# Patient Record
Sex: Male | Born: 1975 | Race: Black or African American | Hispanic: No | Marital: Single | Smoking: Current every day smoker
Health system: Southern US, Community
[De-identification: ages and names within clinical notes are randomized; demographics above are authoritative.]

## PROBLEM LIST (undated history)

## (undated) DIAGNOSIS — K219 Gastro-esophageal reflux disease without esophagitis: Secondary | ICD-10-CM

## (undated) HISTORY — PX: HAND SURGERY: SHX662

---

## 2007-11-22 ENCOUNTER — Emergency Department (HOSPITAL_COMMUNITY): Admission: EM | Admit: 2007-11-22 | Discharge: 2007-11-22 | Payer: Self-pay | Admitting: Emergency Medicine

## 2017-05-28 ENCOUNTER — Encounter (HOSPITAL_COMMUNITY): Payer: Self-pay | Admitting: Emergency Medicine

## 2017-05-28 DIAGNOSIS — G43909 Migraine, unspecified, not intractable, without status migrainosus: Secondary | ICD-10-CM | POA: Insufficient documentation

## 2017-05-28 DIAGNOSIS — F17210 Nicotine dependence, cigarettes, uncomplicated: Secondary | ICD-10-CM | POA: Insufficient documentation

## 2017-05-28 DIAGNOSIS — Z79899 Other long term (current) drug therapy: Secondary | ICD-10-CM | POA: Insufficient documentation

## 2017-05-28 LAB — URINALYSIS, ROUTINE W REFLEX MICROSCOPIC
Bilirubin Urine: NEGATIVE
GLUCOSE, UA: NEGATIVE mg/dL
Hgb urine dipstick: NEGATIVE
KETONES UR: NEGATIVE mg/dL
LEUKOCYTES UA: NEGATIVE
Nitrite: NEGATIVE
PH: 6 (ref 5.0–8.0)
Protein, ur: NEGATIVE mg/dL
Specific Gravity, Urine: 1.018 (ref 1.005–1.030)

## 2017-05-28 LAB — CBC
HEMATOCRIT: 40.3 % (ref 39.0–52.0)
Hemoglobin: 13.7 g/dL (ref 13.0–17.0)
MCH: 31.3 pg (ref 26.0–34.0)
MCHC: 34 g/dL (ref 30.0–36.0)
MCV: 92 fL (ref 78.0–100.0)
Platelets: 185 10*3/uL (ref 150–400)
RBC: 4.38 MIL/uL (ref 4.22–5.81)
RDW: 12.2 % (ref 11.5–15.5)
WBC: 5.2 10*3/uL (ref 4.0–10.5)

## 2017-05-28 LAB — COMPREHENSIVE METABOLIC PANEL
ALBUMIN: 3.9 g/dL (ref 3.5–5.0)
ALT: 24 U/L (ref 17–63)
AST: 31 U/L (ref 15–41)
Alkaline Phosphatase: 51 U/L (ref 38–126)
Anion gap: 11 (ref 5–15)
BUN: 8 mg/dL (ref 6–20)
CHLORIDE: 103 mmol/L (ref 101–111)
CO2: 25 mmol/L (ref 22–32)
Calcium: 8.6 mg/dL — ABNORMAL LOW (ref 8.9–10.3)
Creatinine, Ser: 1.19 mg/dL (ref 0.61–1.24)
GFR calc Af Amer: >60 mL/min (ref >60)
Glucose, Bld: 137 mg/dL — ABNORMAL HIGH (ref 65–99)
POTASSIUM: 3.3 mmol/L — AB (ref 3.5–5.1)
SODIUM: 139 mmol/L (ref 135–145)
Total Bilirubin: 0.7 mg/dL (ref 0.3–1.2)
Total Protein: 6.9 g/dL (ref 6.5–8.1)

## 2017-05-28 LAB — LIPASE, BLOOD: LIPASE: 29 U/L (ref 11–51)

## 2017-05-28 NOTE — ED Triage Notes (Signed)
Pt also reports abdominal pain with n/v beginning today as well.

## 2017-05-28 NOTE — ED Triage Notes (Signed)
Pt c/o migraine x 3 days. States pain got worse with pressure this evening. Sensitivity to light.

## 2017-05-29 ENCOUNTER — Emergency Department (HOSPITAL_COMMUNITY): Payer: BLUE CROSS/BLUE SHIELD

## 2017-05-29 ENCOUNTER — Emergency Department (HOSPITAL_COMMUNITY)
Admission: EM | Admit: 2017-05-29 | Discharge: 2017-05-29 | Disposition: A | Discharge status: Home / Self Care | Payer: BLUE CROSS/BLUE SHIELD | Attending: Emergency Medicine | Admitting: Emergency Medicine

## 2017-05-29 DIAGNOSIS — G43909 Migraine, unspecified, not intractable, without status migrainosus: Secondary | ICD-10-CM

## 2017-05-29 HISTORY — DX: Gastro-esophageal reflux disease without esophagitis: K21.9

## 2017-05-29 MED ORDER — METOCLOPRAMIDE HCL 5 MG/ML IJ SOLN
10.0000 mg | Freq: Once | INTRAMUSCULAR | Status: DC
  Filled 2017-05-29: qty 2

## 2017-05-29 MED ORDER — KETOROLAC TROMETHAMINE 15 MG/ML IJ SOLN
15.0000 mg | Freq: Once | INTRAMUSCULAR | Status: DC
  Filled 2017-05-29: qty 1

## 2017-05-29 MED ORDER — IBUPROFEN 600 MG PO TABS
600.0000 mg | ORAL_TABLET | Freq: Four times a day (QID) | ORAL | 0 refills | Status: AC | PRN
Start: 2017-05-29 — End: ?

## 2017-05-29 NOTE — ED Notes (Signed)
Patient transported to CT 

## 2017-05-29 NOTE — Discharge Instructions (Signed)
We saw you in the ER for headaches. All the labs and imaging are normal. We are not sure what is causing your headaches, however, there appears to be no evidence of infection, bleeds or tumors based on our exam and results. We suspect migraine type headache or cluster headache  Please take motrin round the clock for the next 6 hours, and take other meds prescribed only for break through pain. See your doctor if the pain persists, as you might need better medications or a specialist.  Please return to the ER if the headache gets severe and in not improving, you have associated new one sided numbness, tingling, weakness or confusion, seizures, poor balance or poor vision.

## 2017-05-29 NOTE — ED Provider Notes (Signed)
[facility]-EMERGENCY DEPT Provider Note   CSN: [id number] Arrival date & time: 05/28/17  2117  By signing my name below, I, Diona Browner, attest that this documentation has been prepared under the direction and in the presence of Derwood Kaplan, MD. Electronically Signed: Diona Browner, ED Scribe. 05/29/17. 2:01 AM.  History   Chief Complaint Chief Complaint  Patient presents with  . Migraine  . Abdominal Pain    HPI Jimmy Arellano is a 41 y.o. male who presents to the Emergency Department complaining of gradually worsening, constant, pressure to the left sided temple that started ~ 2 days ago. Pt currently rates his pain a 5/10 severity but at its worst, it was 10/10 severity. Associated sx include nausea, vomiting and left sided weakness, dizziness, sound sensitivity, photophobic Lying down exacerbates his discomfort. Pt notes his discomfort started improving ~ 11:30 pm. He has tried ibuprofen with mild to no relief, last does around 6 pm. Pt reports bumping his head a couple of days ago at work. He denies LOC and "seeing stars." No hx of HA. Occasionally drinks. No drug use. Doesn't smoke. Pt denies numbness.  Additionally he c/o of diarrhea everyday for the last year. Not sure if blood in stool. No weight change. Not sure of FHx.   The history is provided by the patient. No language interpreter was used.    Past Medical History:  Diagnosis Date  . GERD (gastroesophageal reflux disease)     There are no active problems to display for this patient.   Past Surgical History:  Procedure Laterality Date  . HAND SURGERY         Home Medications    Prior to Admission medications   Medication Sig Start Date End Date Taking? Authorizing Provider  omeprazole (PRILOSEC) 20 MG capsule Take 20 mg by mouth daily.   Yes [provider]  ibuprofen (ADVIL,MOTRIN) 600 MG tablet Take 1 tablet (600 mg total) by mouth every 6 (six) hours as needed. 05/29/17   Derwood Kaplan,  MD    Family History No family history on file.  Social History Social History  Substance Use Topics  . Smoking status: Current Every Day Smoker    Types: Cigarettes  . Smokeless tobacco: Never Used  . Alcohol use Yes     Allergies   Patient has no known allergies.   Review of Systems Review of Systems  Eyes: Positive for photophobia.  Gastrointestinal: Positive for diarrhea, nausea and vomiting.  Neurological: Positive for dizziness, weakness and headaches. Negative for numbness.  All other systems reviewed and are negative.    Physical Exam Updated Vital Signs BP 136/86   Pulse (!) 49   Temp 98.6 F (37 C) (Oral)   Resp 16   Ht 6' (1.829 m)   Wt 86.2 kg (190 lb)   SpO2 99%   BMI 25.77 kg/m   Physical Exam  Constitutional: He is oriented to person, place, and time. He appears well-developed and well-nourished.  HENT:  Head: Normocephalic.  Eyes: EOM are normal. Pupils are equal, round, and reactive to light.  Pupils are 4 mm equal.  Neck: Normal range of motion.  No meningismus.  Cardiovascular: Normal rate, regular rhythm and normal heart sounds.   Pulmonary/Chest: Effort normal.  Abdominal: He exhibits no distension.  Musculoskeletal: Normal range of motion.  Neurological: He is alert and oriented to person, place, and time.  No horizontal or vertical nystagmus. Upper and lower extremity motor and sensory exam are grossly normal and equal.  Cerebella exam reveals no dysmetria. No carotid bruit.  Psychiatric: He has a normal mood and affect.  Nursing note and vitals reviewed.    ED Treatments / Results  DIAGNOSTIC STUDIES: Oxygen Saturation is 99% on RA, normal by my interpretation.   COORDINATION OF CARE: 2:01 AM-Discussed next steps with pt. Pt verbalized understanding and is agreeable with the plan.   Labs (all labs ordered are listed, but only abnormal results are displayed) Labs Reviewed  COMPREHENSIVE METABOLIC PANEL - Abnormal; Notable  for the following:       Result Value   Potassium 3.3 (*)    Glucose, Bld 137 (*)    Calcium 8.6 (*)    All other components within normal limits  LIPASE, BLOOD  CBC  URINALYSIS, ROUTINE W REFLEX MICROSCOPIC    EKG  EKG Interpretation  Date/Time:  Wednesday May 28 2017 21:17:19 EDT Ventricular Rate:  87 PR Interval:  156 QRS Duration: 98 QT Interval:  384 QTC Calculation: 462 R Axis:   89 Text Interpretation:  Normal sinus rhythm Incomplete right bundle branch block Nonspecific T wave abnormality Prolonged QT Abnormal ECG Otherwise no significant change Confirmed by Drema Pryardama, Pedro [phone]([id number]) on 05/28/2017 9:29:12 PM       Radiology Ct Head Wo Contrast  Result Date: 05/29/2017 CLINICAL DATA:  Severe headache. EXAM: CT HEAD WITHOUT CONTRAST TECHNIQUE: Contiguous axial images were obtained from the base of the skull through the vertex without intravenous contrast. COMPARISON:  Head CT 11/22/2007 FINDINGS: Brain: No evidence of acute infarction, hemorrhage, hydrocephalus, extra-axial collection or mass lesion/mass effect. Vascular: No hyperdense vessel or unexpected calcification. Skull: Normal. Negative for fracture or focal lesion. Sinuses/Orbits: Paranasal sinuses and mastoid air cells are clear. The visualized orbits are unremarkable. Other: None. IMPRESSION: Unremarkable noncontrast head CT.  No explanation for headache. Electronically Signed   By: Rubye OaksMelanie  Ehinger M.D.   On: 05/29/2017 03:38    Procedures Procedures (including critical care time)  Medications Ordered in ED Medications - No data to display   Initial Impression / Assessment and Plan / ED Course  I have reviewed the triage vital signs and the nursing notes.  Pertinent labs & imaging results that were available during my care of the patient were reviewed by me and considered in my medical decision making (see chart for details).     Pt comes in with cc of headache. Headache is unilateral, with nausea and  emesis. Pt has no hx of similar headache. At some point the headache was described as the worst headache of the life. Neuro exam is non focal and there is no meningismus. Family hx is unclear. Social hx otherwise unremarkable.  DDX includes: Primary headaches - including migrainous headaches, cluster headaches, tension headaches. ICH Carotid dissection Cavernous sinus thrombosis Meningitis Encephalitis Sinusitis Tumor Vascular headaches AV malformation Brain aneurysm Muscular headaches  A/P: Ct head ordered. We [facility] given the patient stated that the headache is the worst he had had, but complex migraine is more likely.   REASSESSMENT: Results from the ER workup discussed with the patient face to face and all questions answered to the best of my ability. CT head is neg. Labs reassuring. Patient reported that the pain is a lot better - w/o any meds.  I discussed with the patient that occult brain bleeds can be missed on CT scan, and we have considered bran bleed in our ddx. We also discussed that the headache has migraine characteristics - but that he has never  had migraines or similar headache and  Males are less likely to get migraines. We then discussed LP to r/o SAH now. Pt appeared apprehensive, so we gave him the option of aggressive management, which includes LP and r/o SAH vs. Conservative approach which would be no LP, d/c with motrin and Neuro f/u with ER return if the symptoms get worse. Risk and benefits for both discussed, including the possible complications of missed SAH and related morbidity and possible mortality. I allowed pt and family to discuss their options and checked with them 20 min later, and they had decided to go with the conservative approach.  Strict ER return precautions have been discussed, and patient is agreeing with the plan and is comfortable with the workup done and the recommendations from the ER.    Final Clinical  Impressions(s) / ED Diagnoses   Final diagnoses:  Migraine without status migrainosus, not intractable, unspecified migraine type    New Prescriptions Discharge Medication List as of 05/29/2017  5:53 AM    START taking these medications   Details  ibuprofen (ADVIL,MOTRIN) 600 MG tablet Take 1 tablet (600 mg total) by mouth every 6 (six) hours as needed., Starting Thu 05/29/2017, Print       I personally performed the services described in this documentation, which was scribed in my presence. The recorded information has been reviewed and is accurate.     Derwood Kaplan, MD 05/31/17 [phone]

## 2017-05-29 NOTE — ED Notes (Signed)
Per Selena BattenKim in CT, pt is next on the list. Pt made aware.

## 2017-05-29 NOTE — ED Notes (Signed)
Pt able to ambulate in waiting room to vending machines and back to seat.

## 2017-05-29 NOTE — ED Notes (Signed)
In room to medicate patient.  States "I've been here seven hours and they are gonna give me something now."  Physician made aware of refusal.

## 2017-10-27 ENCOUNTER — Encounter (HOSPITAL_COMMUNITY): Payer: Self-pay | Admitting: Emergency Medicine

## 2017-10-27 ENCOUNTER — Emergency Department (HOSPITAL_COMMUNITY)
Admission: EM | Admit: 2017-10-27 | Discharge: 2017-10-27 | Disposition: A | Discharge status: Home / Self Care | Payer: BLUE CROSS/BLUE SHIELD | Attending: Emergency Medicine | Admitting: Emergency Medicine

## 2017-10-27 DIAGNOSIS — F329 Major depressive disorder, single episode, unspecified: Secondary | ICD-10-CM | POA: Insufficient documentation

## 2017-10-27 DIAGNOSIS — Z5329 Procedure and treatment not carried out because of patient's decision for other reasons: Secondary | ICD-10-CM | POA: Insufficient documentation

## 2017-10-27 DIAGNOSIS — F17210 Nicotine dependence, cigarettes, uncomplicated: Secondary | ICD-10-CM | POA: Insufficient documentation

## 2017-10-27 DIAGNOSIS — Z79899 Other long term (current) drug therapy: Secondary | ICD-10-CM | POA: Insufficient documentation

## 2017-10-27 DIAGNOSIS — Z046 Encounter for general psychiatric examination, requested by authority: Secondary | ICD-10-CM | POA: Diagnosis not present

## 2017-10-27 DIAGNOSIS — R45851 Suicidal ideations: Secondary | ICD-10-CM | POA: Diagnosis not present

## 2017-10-27 DIAGNOSIS — F32A Depression, unspecified: Secondary | ICD-10-CM

## 2017-10-27 LAB — COMPREHENSIVE METABOLIC PANEL
ALBUMIN: 4.1 g/dL (ref 3.5–5.0)
ALT: 20 U/L (ref 17–63)
AST: 27 U/L (ref 15–41)
Alkaline Phosphatase: 56 U/L (ref 38–126)
Anion gap: 9 (ref 5–15)
BUN: 9 mg/dL (ref 6–20)
CHLORIDE: 103 mmol/L (ref 101–111)
CO2: 27 mmol/L (ref 22–32)
Calcium: 9.1 mg/dL (ref 8.9–10.3)
Creatinine, Ser: 1.17 mg/dL (ref 0.61–1.24)
GFR calc Af Amer: >60 mL/min (ref >60)
GFR calc non Af Amer: >60 mL/min (ref >60)
GLUCOSE: 92 mg/dL (ref 65–99)
POTASSIUM: 3.6 mmol/L (ref 3.5–5.1)
Sodium: 139 mmol/L (ref 135–145)
Total Bilirubin: 0.9 mg/dL (ref 0.3–1.2)
Total Protein: 7.8 g/dL (ref 6.5–8.1)

## 2017-10-27 LAB — CBC
HEMATOCRIT: 44.8 % (ref 39.0–52.0)
HEMOGLOBIN: 15.5 g/dL (ref 13.0–17.0)
MCH: 32.3 pg (ref 26.0–34.0)
MCHC: 34.6 g/dL (ref 30.0–36.0)
MCV: 93.3 fL (ref 78.0–100.0)
Platelets: 208 10*3/uL (ref 150–400)
RBC: 4.8 MIL/uL (ref 4.22–5.81)
RDW: 12.8 % (ref 11.5–15.5)
WBC: 8.6 10*3/uL (ref 4.0–10.5)

## 2017-10-27 LAB — RAPID URINE DRUG SCREEN, HOSP PERFORMED
Amphetamines: NOT DETECTED
BARBITURATES: NOT DETECTED
BENZODIAZEPINES: NOT DETECTED
Cocaine: NOT DETECTED
Opiates: NOT DETECTED
Tetrahydrocannabinol: NOT DETECTED

## 2017-10-27 LAB — ACETAMINOPHEN LEVEL

## 2017-10-27 LAB — ETHANOL: Alcohol, Ethyl (B): <10 mg/dL (ref <10)

## 2017-10-27 LAB — SALICYLATE LEVEL

## 2017-10-27 NOTE — Progress Notes (Signed)
TTS consulted with EDP Caccavale, Sophia, PA-C who states the pt eloped prior to being evaluated by TTS. Per EDP note, "Patient eloped prior to TTS eval.  He has never attempted to commit suicide nor had suicidal thoughts before.  Currently without suicidal thoughts or plan.  He is contracting for safety.  I believe he is low risk at this time, but would have benefited from resources. Discussed with attending, and Dr. Particia NearingHaviland agrees"   EDP Caccavale, Sophia, PA-C reports there is no need to IVC the pt. Consult to be canceled.   Princess BruinsAquicha Darnelle Arellano, MSW, LCSW Therapeutic Triage Specialist  [phone]

## 2017-10-27 NOTE — ED Provider Notes (Signed)
[facility]-EMERGENCY DEPT Provider Note   CSN: [id number] Arrival date & time: 10/27/17  1421     History   Chief Complaint Chief Complaint  Patient presents with  . Psychiatric Evaluation    HPI Jimmy Arellano is a 41 y.o. male presenting for evaluation of mood.  Patient states that he has had a lot going on in his life and he is tired of it.  He states that his daughter and his fiance made him come to the emergency room.  Patient states that he told his daughter this morning that he was thinking about killing himself and had a plan, but he would not tell me what it was.  He denies previous suicidal attempts.  He states that he had problems with depression when he was a child and on medication from ages [age], but he is not currently on anything.  He has never spoken to a counselor or psychologist, or psychiatrist as an adult.  He has never been admitted for suicidal thoughts or depression.  Patient states he is not sleeping well, energy levels are down unless he takes supplements, and he has a poor appetite.  Smokes cigarettes daily, states he drinks "a lot" denies other drug use.  He denies homicidal thoughts, or AVH.  He denies current suicidal thoughts or plan. Patient states he has a history of GERD for which he takes Prilosec.  He denies fevers, chills, chest pain, shortness of breath, nausea, vomiting, abdominal pain, urinary symptoms, abnormal bowel movements.  HPI  Past Medical History:  Diagnosis Date  . GERD (gastroesophageal reflux disease)     There are no active problems to display for this patient.   Past Surgical History:  Procedure Laterality Date  . HAND SURGERY         Home Medications    Prior to Admission medications   Medication Sig Start Date End Date Taking? Authorizing Provider  aspirin-acetaminophen-caffeine (EXCEDRIN MIGRAINE) [phone] MG tablet Take 2 tablets every 6 (six) hours as needed by mouth for headache.   Yes  [provider]  ibuprofen (ADVIL,MOTRIN) 600 MG tablet Take 1 tablet (600 mg total) by mouth every 6 (six) hours as needed. 05/29/17  Yes Derwood KaplanNanavati, Ankit, MD  omeprazole (PRILOSEC) 20 MG capsule Take 20 mg by mouth daily.   Yes [provider]    Family History No family history on file.  Social History Social History   Tobacco Use  . Smoking status: Current Every Day Smoker    Types: Cigarettes  . Smokeless tobacco: Never Used  Substance Use Topics  . Alcohol use: Yes  . Drug use: No     Allergies   Patient has no known allergies.   Review of Systems Review of Systems  Constitutional: Negative for chills and fever.  Psychiatric/Behavioral: Positive for dysphoric mood, sleep disturbance and suicidal ideas.     Physical Exam Updated Vital Signs BP (!) 153/103 (BP Location: Right Arm)   Pulse 85   Temp 98.9 F (37.2 C) (Oral)   Resp 17   SpO2 100%   Physical Exam  Constitutional: He is oriented to person, place, and time. He appears well-developed and well-nourished. No distress.  HENT:  Head: Normocephalic and atraumatic.  Eyes: EOM are normal.  Neck: Normal range of motion.  Cardiovascular: Normal rate, regular rhythm and intact distal pulses.  Pulmonary/Chest: Effort normal and breath sounds normal. No respiratory distress. He has no wheezes.  Abdominal: Soft. He exhibits no distension. There is  no tenderness.  Musculoskeletal: Normal range of motion.  Neurological: He is alert and oriented to person, place, and time.  Skin: Skin is warm. No rash noted.  Psychiatric: His affect is blunt. He is withdrawn. He is not actively hallucinating. He expresses suicidal ideation. He expresses no homicidal ideation. He expresses suicidal plans. He expresses no homicidal plans.  The patient is noncommunicative and withdrawn initially, became more talkative throughout interview.  Affect remained blunted.  Currently denies SI or plan, but states that he had  SI and plan this morning.  Nursing note and vitals reviewed.    ED Treatments / Results  Labs (all labs ordered are listed, but only abnormal results are displayed) Labs Reviewed  ACETAMINOPHEN LEVEL - Abnormal; Notable for the following components:      Result Value   Acetaminophen (Tylenol), Serum <10 (*)    All other components within normal limits  COMPREHENSIVE METABOLIC PANEL  ETHANOL  CBC  RAPID URINE DRUG SCREEN, HOSP PERFORMED  SALICYLATE LEVEL    EKG  EKG Interpretation None       Radiology No results found.  Procedures Procedures (including critical care time)  Medications Ordered in ED Medications - No data to display   Initial Impression / Assessment and Plan / ED Course  I have reviewed the triage vital signs and the nursing notes.  Pertinent labs & imaging results that were available during my care of the patient were reviewed by me and considered in my medical decision making (see chart for details).     Patient presenting due to suicidal thoughts.  No history of suicidal thoughts or attempts.  Not on any medication currently.  Patient told me he was having suicidal thoughts with a plan this morning, currently denies thoughts or plan.  Will obtain basic labs for medical clearance.  Labs reassuring.  Patient is medically cleared for TTS eval.  Patient eloped prior to TTS eval.  He has never attempted to commit suicide nor had suicidal thoughts before.  Currently without suicidal thoughts or plan.  He is contracting for safety.  I believe he is low risk at this time, but would have benefited from resources. Discussed with attending, and Dr. Particia NearingHaviland agrees.   Final Clinical Impressions(s) / ED Diagnoses   Final diagnoses:  Depression, unspecified depression type    ED Discharge Orders    None       Alveria ApleyCaccavale, Llewellyn Choplin, PA-C 10/27/17 1829    Wynetta FinesMessick, Peter C, MD 11/09/17 [phone]

## 2017-10-27 NOTE — ED Notes (Signed)
Pt has wallet and cell phone in patient belongings beg in 9-12 section cabinet.

## 2017-10-27 NOTE — ED Triage Notes (Signed)
Pt comes in with fiance with complaints of increased depression.  Per pt he does not feel like harming himself or anyone else but has just had "a lot going on".  Pt not readily giving information and does not elaborate.  Pt ambulatory & A&O x4.  Pt seems withdrawn on assessment.

## 2017-10-27 NOTE — ED Notes (Signed)
[facility] Expected date:  Expected time:  Means of arrival:  Comments: [place]

## 2017-10-27 NOTE — ED Notes (Addendum)
Pt came to the desk, states he wants to leave.  He states that he has to be at work at [employer] tonight and cannot miss it.  Attempted to talk to pt in staying, offered to give him a work note, but states he cannot miss work.  Pt reports he does not know why he is here, his fiance had asked him to come to the ED d/t worsening depression.  States he has not taken any his depression med for a while.  States he was dx with depression after his mother died when he was 11yo.  Pt is asking where his fiance is, he states she was just going to park the car and come back.  This nurse went to check the waiting area for her and even checked outside.  Pt made aware and suggested to call her. Pt denies SI/HI.  He states he was [place] to [place] in [place] and had verbalized "something that made her call the police."

## 2017-10-27 NOTE — ED Notes (Signed)
Stacy CN made this nurse aware that pt did not want to stay and left.

## 2018-08-08 ENCOUNTER — Emergency Department (HOSPITAL_COMMUNITY)
Admission: EM | Admit: 2018-08-08 | Discharge: 2018-08-08 | Disposition: A | Discharge status: Home / Self Care | Payer: BLUE CROSS/BLUE SHIELD | Attending: Emergency Medicine | Admitting: Emergency Medicine

## 2018-08-08 ENCOUNTER — Other Ambulatory Visit: Payer: Self-pay

## 2018-08-08 ENCOUNTER — Encounter (HOSPITAL_COMMUNITY): Payer: Self-pay | Admitting: Emergency Medicine

## 2018-08-08 DIAGNOSIS — F17210 Nicotine dependence, cigarettes, uncomplicated: Secondary | ICD-10-CM | POA: Insufficient documentation

## 2018-08-08 DIAGNOSIS — Z202 Contact with and (suspected) exposure to infections with a predominantly sexual mode of transmission: Secondary | ICD-10-CM | POA: Insufficient documentation

## 2018-08-08 DIAGNOSIS — Z79899 Other long term (current) drug therapy: Secondary | ICD-10-CM | POA: Insufficient documentation

## 2018-08-08 DIAGNOSIS — R197 Diarrhea, unspecified: Secondary | ICD-10-CM

## 2018-08-08 LAB — COMPREHENSIVE METABOLIC PANEL
ALT: 17 U/L (ref 0–44)
AST: 22 U/L (ref 15–41)
Albumin: 3.9 g/dL (ref 3.5–5.0)
Alkaline Phosphatase: 54 U/L (ref 38–126)
Anion gap: 12 (ref 5–15)
BUN: 7 mg/dL (ref 6–20)
CHLORIDE: 101 mmol/L (ref 98–111)
CO2: 25 mmol/L (ref 22–32)
CREATININE: 1.01 mg/dL (ref 0.61–1.24)
Calcium: 9.3 mg/dL (ref 8.9–10.3)
GFR calc Af Amer: >60 mL/min (ref >60)
GFR calc non Af Amer: >60 mL/min (ref >60)
Glucose, Bld: 150 mg/dL — ABNORMAL HIGH (ref 70–99)
POTASSIUM: 3 mmol/L — AB (ref 3.5–5.1)
SODIUM: 138 mmol/L (ref 135–145)
Total Bilirubin: 1.3 mg/dL — ABNORMAL HIGH (ref 0.3–1.2)
Total Protein: 7.2 g/dL (ref 6.5–8.1)

## 2018-08-08 LAB — CBC
HEMATOCRIT: 44.9 % (ref 39.0–52.0)
Hemoglobin: 15.1 g/dL (ref 13.0–17.0)
MCH: 32 pg (ref 26.0–34.0)
MCHC: 33.6 g/dL (ref 30.0–36.0)
MCV: 95.1 fL (ref 78.0–100.0)
PLATELETS: 204 10*3/uL (ref 150–400)
RBC: 4.72 MIL/uL (ref 4.22–5.81)
RDW: 12 % (ref 11.5–15.5)
WBC: 7.4 10*3/uL (ref 4.0–10.5)

## 2018-08-08 LAB — URINALYSIS, ROUTINE W REFLEX MICROSCOPIC
BILIRUBIN URINE: NEGATIVE
Bacteria, UA: NONE SEEN
Glucose, UA: NEGATIVE mg/dL
HGB URINE DIPSTICK: NEGATIVE
Ketones, ur: NEGATIVE mg/dL
Nitrite: NEGATIVE
PH: 6 (ref 5.0–8.0)
Protein, ur: NEGATIVE mg/dL
SPECIFIC GRAVITY, URINE: 1.012 (ref 1.005–1.030)

## 2018-08-08 LAB — LIPASE, BLOOD: LIPASE: 37 U/L (ref 11–51)

## 2018-08-08 MED ORDER — SODIUM CHLORIDE 0.9 % IV BOLUS
1000.0000 mL | Freq: Once | INTRAVENOUS | Status: AC
  Administered 2018-08-08: 1000 mL via INTRAVENOUS

## 2018-08-08 MED ORDER — POTASSIUM CHLORIDE CRYS ER 20 MEQ PO TBCR
40.0000 meq | EXTENDED_RELEASE_TABLET | Freq: Once | ORAL | Status: AC
  Administered 2018-08-08: 40 meq via ORAL
  Filled 2018-08-08: qty 2

## 2018-08-08 MED ORDER — SODIUM CHLORIDE 0.9 % IV SOLN
1.0000 g | Freq: Once | INTRAVENOUS | Status: AC
  Administered 2018-08-08: 1 g via INTRAVENOUS
  Filled 2018-08-08: qty 10

## 2018-08-08 MED ORDER — DEXTROSE 5 % IV SOLN
250.0000 mg | Freq: Once | INTRAVENOUS | Status: DC
  Filled 2018-08-08: qty 250

## 2018-08-08 MED ORDER — AZITHROMYCIN 250 MG PO TABS
1000.0000 mg | ORAL_TABLET | Freq: Once | ORAL | Status: AC
  Administered 2018-08-08: 1000 mg via ORAL
  Filled 2018-08-08: qty 4

## 2018-08-08 NOTE — ED Notes (Signed)
ED MD resident at bedside.

## 2018-08-08 NOTE — ED Notes (Signed)
Pt verbalized understanding of d/c instructions and has no further questions, VSS, NAD.  

## 2018-08-08 NOTE — ED Provider Notes (Signed)
[facility] EMERGENCY DEPARTMENT Provider Note   CSN: [id number] Arrival date & time: 08/08/18  1407     History   Chief Complaint Chief Complaint  Patient presents with  . Abdominal Pain  . Diarrhea    HPI Cledith Abdou is a 42 y.o. male.   Diarrhea   This is a new problem. Episode onset: 8 weeks ago. The problem occurs 5 to 10 times per day. The problem has not changed since onset.The stool consistency is described as watery. There has been no fever. Associated symptoms include headaches (intermittent headaches, unchanged per patient, resolves with ibuprofen). Pertinent negatives include no vomiting, no arthralgias, no myalgias, no URI and no cough. He has tried nothing for the symptoms. His past medical history does not include recent abdominal surgery.    Past Medical History:  Diagnosis Date  . GERD (gastroesophageal reflux disease)     There are no active problems to display for this patient.   Past Surgical History:  Procedure Laterality Date  . HAND SURGERY          Home Medications    Prior to Admission medications   Medication Sig Start Date End Date Taking? Authorizing Provider  aspirin-acetaminophen-caffeine (EXCEDRIN MIGRAINE) [phone] MG tablet Take 2 tablets every 6 (six) hours as needed by mouth for headache.    [provider]  ibuprofen (ADVIL,MOTRIN) 600 MG tablet Take 1 tablet (600 mg total) by mouth every 6 (six) hours as needed. 05/29/17   Derwood Kaplan, MD  omeprazole (PRILOSEC) 20 MG capsule Take 20 mg by mouth daily.    [provider]    Family History No family history on file.  Social History Social History   Tobacco Use  . Smoking status: Current Every Day Smoker    Types: Cigarettes  . Smokeless tobacco: Never Used  Substance Use Topics  . Alcohol use: Yes  . Drug use: No     Allergies   Patient has no known allergies.   Review of Systems Review of Systems  Respiratory:  Negative for cough.   Gastrointestinal: Positive for diarrhea. Negative for vomiting.  Genitourinary: Positive for discharge (white cloudy discharge).  Musculoskeletal: Negative for arthralgias and myalgias.  Neurological: Positive for headaches (intermittent headaches, unchanged per patient, resolves with ibuprofen).     Physical Exam Updated Vital Signs BP 132/81   Pulse 74   Temp 98.5 F (36.9 C)   Resp 13   SpO2 99%   Physical Exam  Constitutional: He appears well-developed and well-nourished.  HENT:  Head: Normocephalic and atraumatic.  Eyes: Conjunctivae are normal.  Neck: Neck supple.  Cardiovascular: Normal rate and regular rhythm.  No murmur heard. Pulmonary/Chest: Effort normal and breath sounds normal. No respiratory distress.  Abdominal: Soft. There is no tenderness.  Genitourinary: Testes normal and penis normal. Right testis shows no tenderness. Left testis shows no tenderness.  Musculoskeletal: He exhibits no edema.  Neurological: He is alert.  Skin: Skin is warm and dry.  Psychiatric: He has a normal mood and affect.  Nursing note and vitals reviewed.    ED Treatments / Results  Labs (all labs ordered are listed, but only abnormal results are displayed) Labs Reviewed  COMPREHENSIVE METABOLIC PANEL - Abnormal; Notable for the following components:      Result Value   Potassium 3.0 (*)    Glucose, Bld 150 (*)    Total Bilirubin 1.3 (*)    All other components within normal limits  URINALYSIS, ROUTINE W  REFLEX MICROSCOPIC - Abnormal; Notable for the following components:   Leukocytes, UA SMALL (*)    All other components within normal limits  LIPASE, BLOOD  CBC  HIV ANTIBODY (ROUTINE TESTING)  RPR  GC/CHLAMYDIA PROBE AMP ([place]) [facility]    EKG None  Radiology No results found.  Procedures Procedures (including critical care time)  Medications Ordered in ED Medications  potassium chloride SA (K-DUR,KLOR-CON) CR tablet 40 mEq (40  mEq Oral Given 08/08/18 1618)  sodium chloride 0.9 % bolus 1,000 mL (0 mLs Intravenous Stopped 08/08/18 1719)  azithromycin (ZITHROMAX) tablet 1,000 mg (1,000 mg Oral Given 08/08/18 1618)  cefTRIAXone (ROCEPHIN) 1 g in sodium chloride 0.9 % 100 mL IVPB (0 g Intravenous Stopped 08/08/18 1650)     Initial Impression / Assessment and Plan / ED Course  I have reviewed the triage vital signs and the nursing notes.  Pertinent labs & imaging results that were available during my care of the patient were reviewed by me and considered in my medical decision making (see chart for details).     The patient is a 42 year old man with noncontributory past medical history who presents with concern for sexually transmitted infection in 8 weeks of diarrhea.  The patient reports that he has had 8 sexual partners in the last 6 months and that he uses condoms most of the time.  He reports that 2 weeks ago he had an encounter with a woman who he was concerned may have had a sexually transmitted infection.  The patient reports that he has been having cloudy white discharge from his penis, but he denies any new sores or lesions on his genitals.  The patient reports that he has not had any fevers, but he does have a pale tongue that is concerned the rash.  Exam does not reveal any thrush, but does reveals some white discharge from his penis.  Gonorrhea, chlamydia, HIV, and RPR sent off for testing patient treated empirically for gonorrhea and chlamydia.   The patient agrees to try a course of Imodium for his diarrhea.  Patient is found to have hypokalemia and was given potassium.  Patient was also given 1 L of IV fluids. Patient reports that he does not have a primary care provider, so he was instructed to call his insurance provider to find PCPs it would be in network for him.  The patient reports understanding and agreement with this plan.  Care supervised by Dr. Rush Landmarkegeler.  Nash DimmerJulia Hansel Devan, MD   Final Clinical  Impressions(s) / ED Diagnoses   Final diagnoses:  Diarrhea, unspecified type  Sexually transmitted disease exposure    ED Discharge Orders    None       Nash DimmerProkesova, Nil Xiong, MD 08/09/18 1302    Tegeler, Canary Brimhristopher J, MD 08/10/18 1058

## 2018-08-08 NOTE — ED Triage Notes (Addendum)
Pt states for 8 weeks he has been having intermittent diarrhea. He also states he has had hyperactive bowel sounds for a week. Pt also wants to be checked for the white thrush in his mouth. Pt also has pain with urination for 1 week. Pt also has cloudy white discharge.

## 2018-08-09 LAB — HIV ANTIBODY (ROUTINE TESTING W REFLEX): HIV Screen 4th Generation wRfx: NONREACTIVE

## 2018-08-09 LAB — RPR: RPR: NONREACTIVE

## 2018-12-14 IMAGING — CT CT HEAD W/O CM
4 series · 17 of 47 positions shown, 19 images · non-contrast
Comparison: Head CT 11/22/2007

CLINICAL DATA: Severe headache.

EXAM:
CT HEAD WITHOUT CONTRAST
TECHNIQUE: Contiguous axial images were obtained from the base of the skull
through the vertex without intravenous contrast.

[Series 3: head without · axial · non-contrast · 0.44mm/px · z∈[+1328,+1448]mm · 7 of 34 slices shown, 9 images]
[im 5/34  brain]
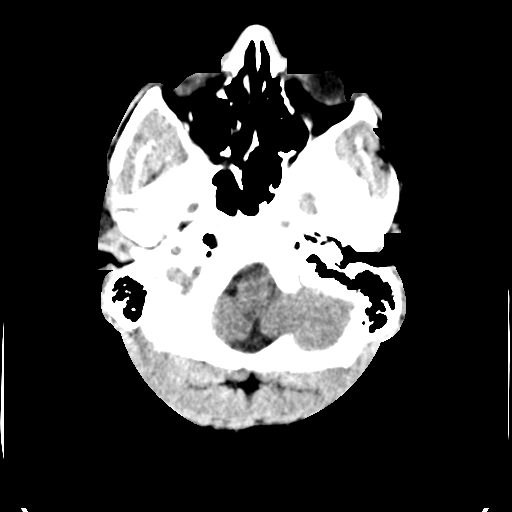
[im 5/34  bone]
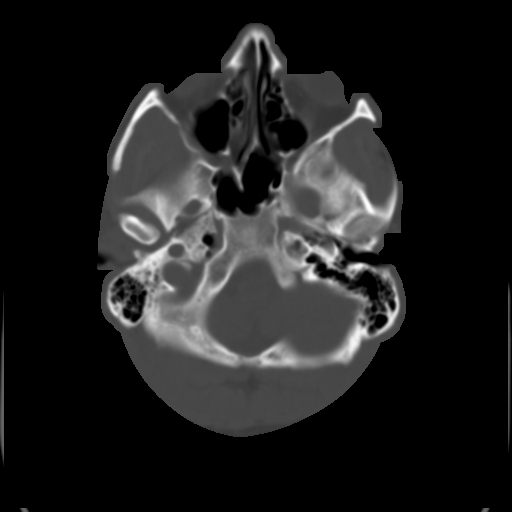
[im 9/34  brain]
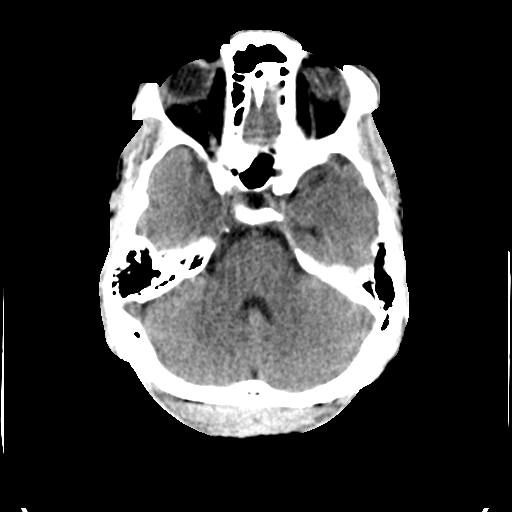
[im 13/34  brain]
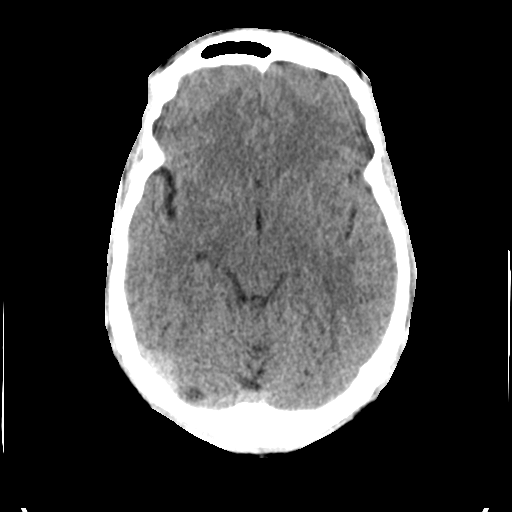
[im 17/34  brain]
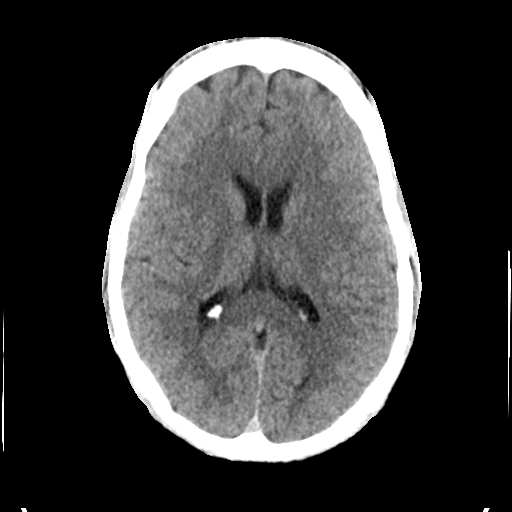
[im 21/34  brain]
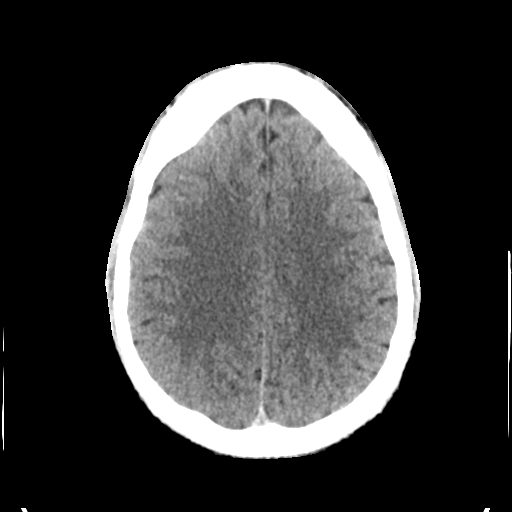
[im 21/34  bone]
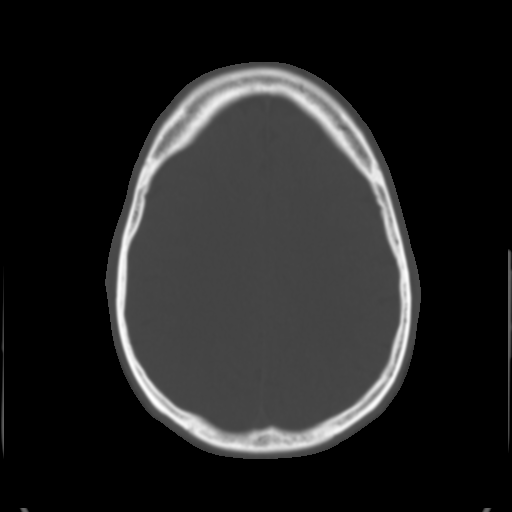
[im 25/34  brain]
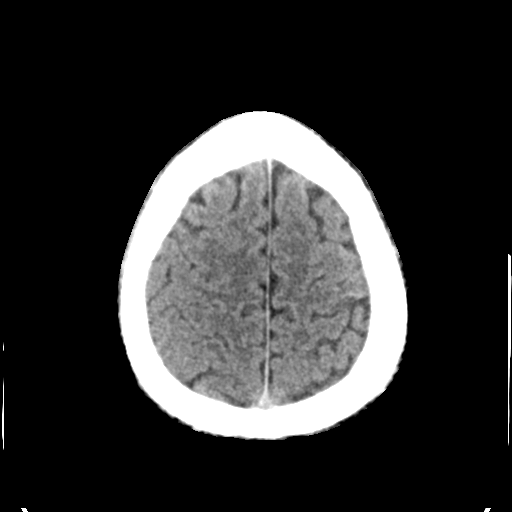
[im 29/34  brain]
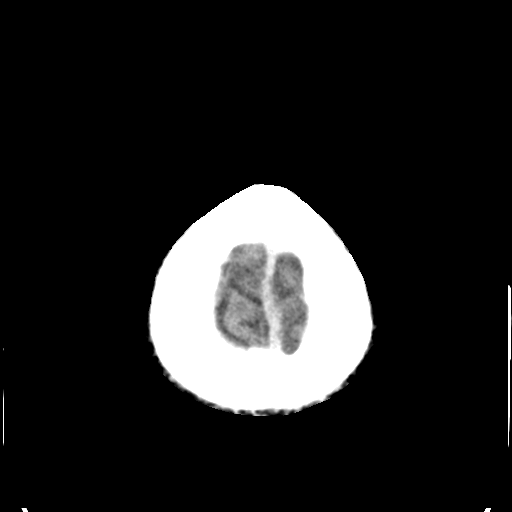

[Series 4: head bone · axial · 0.44mm/px · z∈[+1324,+1380]mm · 4 of 83 slices shown]
[im 9/83  bone]
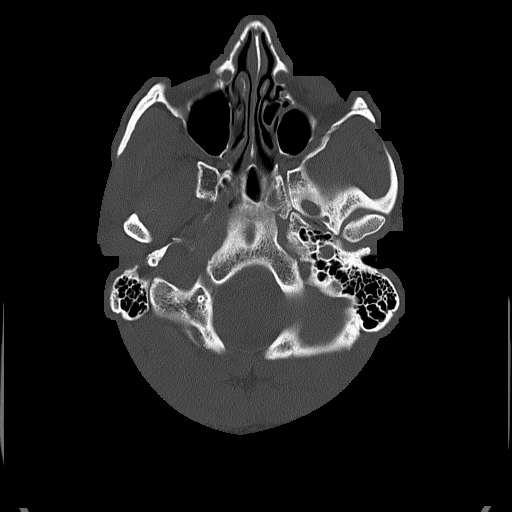
[im 17/83  bone]
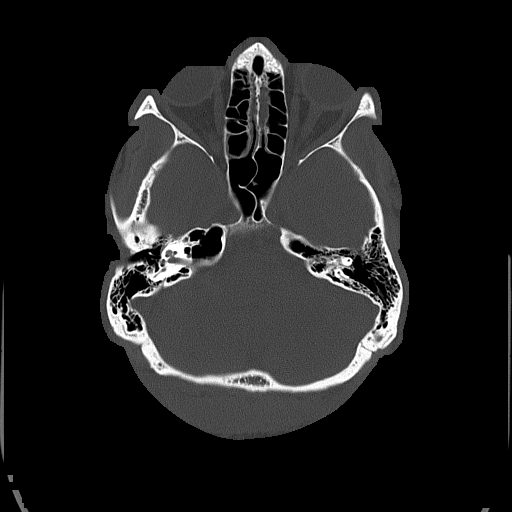
[im 25/83  bone]
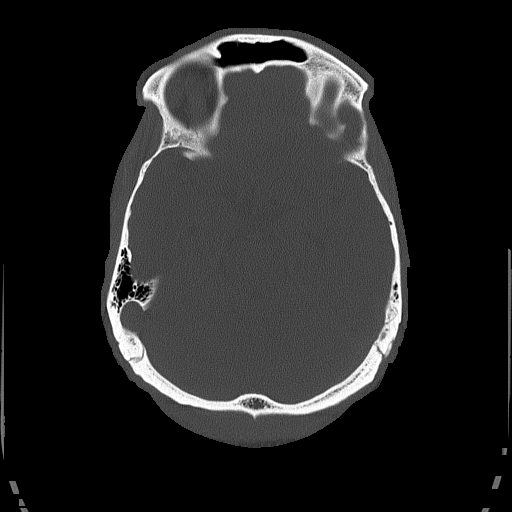
[im 37/83  bone]
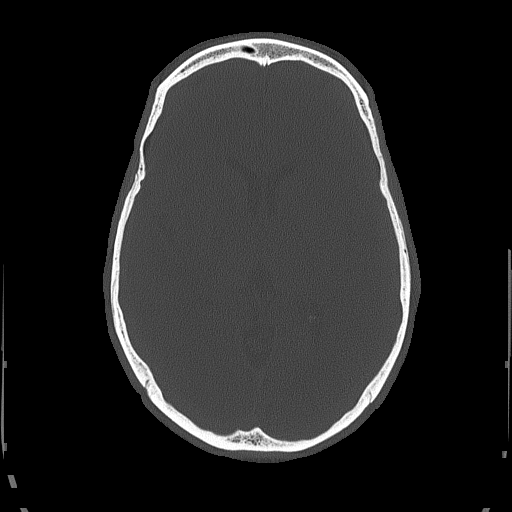

[Series 5: head without cor · coronal · non-contrast · 0.34mm/px · 3 of 70 slices shown]
[im 24/70  brain]
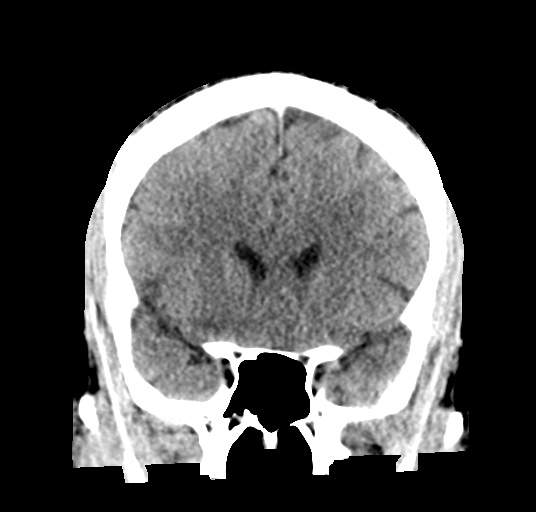
[im 31/70  brain]
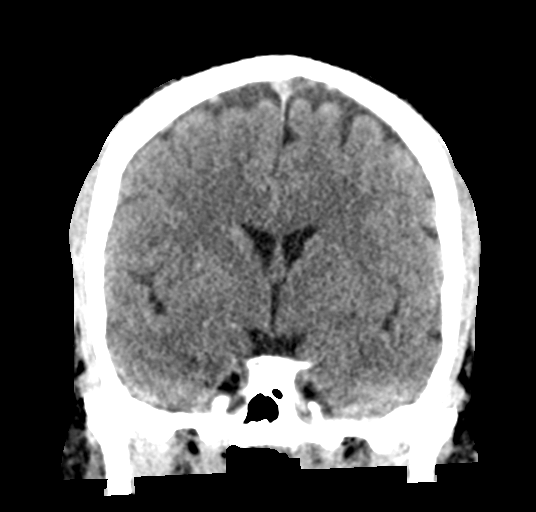
[im 39/70  brain]
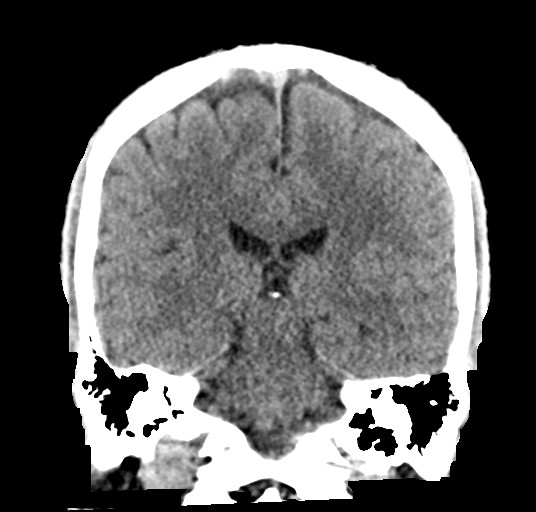

[Series 6: head without sag · sagittal · non-contrast · 0.35mm/px · 3 of 58 slices shown]
[im 20/58  brain]
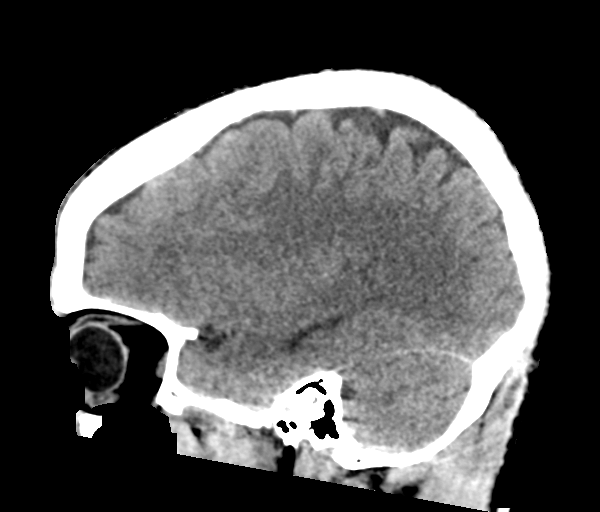
[im 29/58  brain]
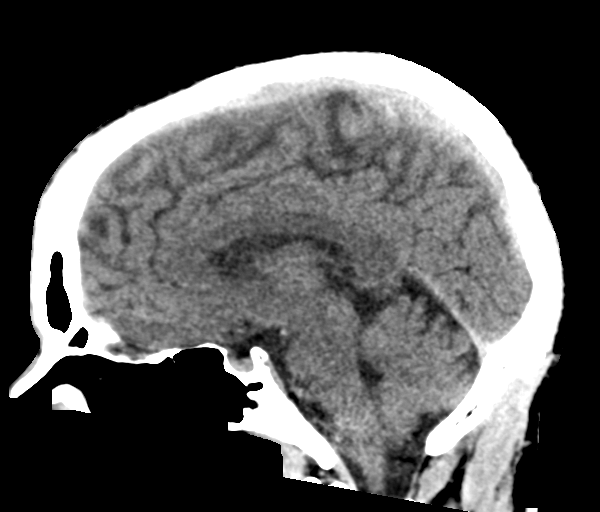
[im 39/58  brain]
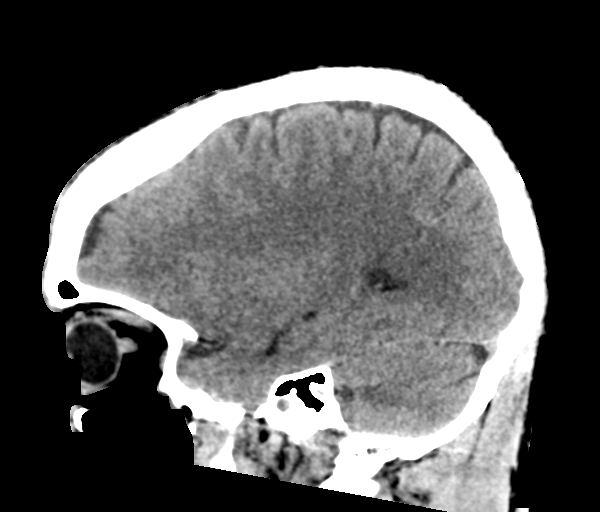

[17 of 47 positions shown; findings below may reference images not displayed]

FINDINGS: Brain: No evidence of acute infarction, hemorrhage, hydrocephalus,
extra-axial collection or mass lesion/mass effect.

Vascular: No hyperdense vessel or unexpected calcification.

Skull: Normal. Negative for fracture or focal lesion.

Sinuses/Orbits: Paranasal sinuses and mastoid air cells are clear.
The visualized orbits are unremarkable.

Other: None.
IMPRESSION: Unremarkable noncontrast head CT.  No explanation for headache.

## 2019-01-26 ENCOUNTER — Emergency Department (HOSPITAL_BASED_OUTPATIENT_CLINIC_OR_DEPARTMENT_OTHER)
Admission: EM | Admit: 2019-01-26 | Discharge: 2019-01-26 | Disposition: A | Discharge status: Home / Self Care | Payer: Self-pay | Attending: Emergency Medicine | Admitting: Emergency Medicine

## 2019-01-26 ENCOUNTER — Other Ambulatory Visit: Payer: Self-pay

## 2019-01-26 ENCOUNTER — Encounter (HOSPITAL_BASED_OUTPATIENT_CLINIC_OR_DEPARTMENT_OTHER): Payer: Self-pay | Admitting: *Deleted

## 2019-01-26 DIAGNOSIS — F1099 Alcohol use, unspecified with unspecified alcohol-induced disorder: Secondary | ICD-10-CM | POA: Insufficient documentation

## 2019-01-26 DIAGNOSIS — Z5321 Procedure and treatment not carried out due to patient leaving prior to being seen by health care provider: Secondary | ICD-10-CM | POA: Insufficient documentation

## 2019-01-26 NOTE — ED Triage Notes (Signed)
Brought in from hotel by EMS for ETOH.

## 2019-01-26 NOTE — ED Notes (Signed)
Pt wife states they are leaving
